# Patient Record
Sex: Female | Born: 1995 | Race: Black or African American | Hispanic: No | Marital: Single | Smoking: Never smoker
Health system: Southern US, Community
[De-identification: ages and names within clinical notes are randomized; demographics above are authoritative.]

---

## 2016-07-02 ENCOUNTER — Encounter (HOSPITAL_COMMUNITY): Payer: Self-pay

## 2016-07-02 ENCOUNTER — Emergency Department (HOSPITAL_COMMUNITY)
Admission: EM | Admit: 2016-07-02 | Discharge: 2016-07-02 | Disposition: A | Discharge status: Home / Self Care | Payer: BLUE CROSS/BLUE SHIELD | Attending: Emergency Medicine | Admitting: Emergency Medicine

## 2016-07-02 DIAGNOSIS — R300 Dysuria: Secondary | ICD-10-CM | POA: Diagnosis not present

## 2016-07-02 DIAGNOSIS — N898 Other specified noninflammatory disorders of vagina: Secondary | ICD-10-CM | POA: Insufficient documentation

## 2016-07-02 LAB — URINALYSIS, ROUTINE W REFLEX MICROSCOPIC
Bacteria, UA: NONE SEEN
Bilirubin Urine: NEGATIVE
GLUCOSE, UA: NEGATIVE mg/dL
KETONES UR: NEGATIVE mg/dL
Nitrite: NEGATIVE
PH: 6 (ref 5.0–8.0)
PROTEIN: NEGATIVE mg/dL
Specific Gravity, Urine: 1.011 (ref 1.005–1.030)

## 2016-07-02 LAB — WET PREP, GENITAL
CLUE CELLS WET PREP: NONE SEEN
Sperm: NONE SEEN
TRICH WET PREP: NONE SEEN
YEAST WET PREP: NONE SEEN

## 2016-07-02 LAB — POC URINE PREG, ED: Preg Test, Ur: NEGATIVE

## 2016-07-02 MED ORDER — AZITHROMYCIN 250 MG PO TABS
1000.0000 mg | ORAL_TABLET | Freq: Once | ORAL | Status: AC
  Administered 2016-07-02: 1000 mg via ORAL
  Filled 2016-07-02: qty 4

## 2016-07-02 MED ORDER — CEFTRIAXONE SODIUM 250 MG IJ SOLR
250.0000 mg | Freq: Once | INTRAMUSCULAR | Status: AC
  Administered 2016-07-02: 250 mg via INTRAMUSCULAR
  Filled 2016-07-02: qty 250

## 2016-07-02 MED ORDER — STERILE WATER FOR INJECTION IJ SOLN
INTRAMUSCULAR | Status: AC
  Administered 2016-07-02: 10 mL
  Filled 2016-07-02: qty 10

## 2016-07-02 NOTE — ED Triage Notes (Signed)
Pt complaining of burning with urination and white vaginal discharge x 4 days. Pt denies any abdominal pain, N/V/D.

## 2016-07-02 NOTE — Discharge Instructions (Signed)
Please read and follow all provided instructions.  Your diagnoses today include:  1. Vaginal discharge    Tests performed today include: Test for gonorrhea and chlamydia. You will be notified by telephone if you have a positive result. Vital signs. See below for your results today.   Medications:  You were treated for chlamydia (1 gram azithromycin pills) and gonorrhea (250mg  rocephin shot).  Home care instructions:  Read educational materials contained in this packet and follow any instructions provided.   You should tell your partners about your infection and avoid having sex for one week to allow time for the medicine to work.  Follow-up instructions: You [facility] to be tested for HIV, syphilis, and hepatitis -- all of which can be transmitted by sexual contact. We do not routinely screen for these in the Emergency Department.  STD Testing: [facility] [address], [place], phone [phone] or [phone]   Monday - Friday, call for an appointment [facility] [place] [address], [place], phone [phone] or [phone]  Monday - Friday, call for an appointment  Return instructions:  Please return to the Emergency Department if you experience worsening symptoms.  Please return if you have any other emergent concerns.  Additional Information:  Your vital signs today were: BP 118/77    Pulse 91    Temp 99.3 F (37.4 C) (Oral)    Resp 16    LMP 06/24/2016 (Exact Date)    SpO2 99%  If your blood pressure (BP) was elevated above 135/85 this visit, please have this repeated by your doctor within one month. --------------

## 2016-07-02 NOTE — ED Provider Notes (Signed)
[facility] Provider Note   CSN: [id number] Arrival date & time: 07/02/16  1707     History   Chief Complaint Chief Complaint  Patient presents with  . Dysuria  . Vaginal Discharge    HPI Victoria Reid is a 21 y.o. female.  HPI  21 y.o. female presents to the Emergency Department today complaining of vaginal discharge and dysuria x 4 days. No fevers. No N/V/D. No CP/SOB/ABD pain. Notes copious discharge thatt is white in color. Mild itching associated. No odor. Notes intercourse x 1 week ago with known partner. No other symptoms noted.    History reviewed. No pertinent past medical history.  There are no active problems to display for this patient.   History reviewed. No pertinent surgical history.  OB History    No data available       Home Medications    Prior to Admission medications   Not on File    Family History History reviewed. No pertinent family history.  Social History Social History  Substance Use Topics  . Smoking status: Never Smoker  . Smokeless tobacco: Never Used  . Alcohol use Yes     Allergies   Patient has no allergy information on record.   Review of Systems Review of Systems ROS reviewed and all are negative for acute change except as noted in the HPI.  Physical Exam Updated Vital Signs BP 118/77   Pulse 91   Temp 99.3 F (37.4 C) (Oral)   Resp 16   LMP 06/24/2016 (Exact Date)   SpO2 99%   Physical Exam  Constitutional: She is oriented to person, place, and time. Vital signs are normal. She appears well-developed and well-nourished.  HENT:  Head: Normocephalic and atraumatic.  Right Ear: Hearing normal.  Left Ear: Hearing normal.  Eyes: Conjunctivae and EOM are normal. Pupils are equal, round, and reactive to light.  Neck: Normal range of motion. Neck supple.  Cardiovascular: Normal rate, regular rhythm, normal heart sounds and intact distal pulses.   Pulmonary/Chest: Effort normal and breath sounds normal.    Abdominal: Soft. Normal appearance and bowel sounds are normal. There is no tenderness.  Neurological: She is alert and oriented to person, place, and time.  Skin: Skin is warm and dry.  Psychiatric: She has a normal mood and affect. Her speech is normal and behavior is normal. Thought content normal.  Nursing note and vitals reviewed.  Exam performed by Eston Esters,  exam chaperoned Date: 07/02/2016 Pelvic exam: normal external genitalia without evidence of trauma. VULVA: normal appearing vulva with no masses, tenderness or lesion. VAGINA: normal appearing vagina with normal color and discharge, no lesions. CERVIX: normal appearing cervix without lesions, cervical motion tenderness absent, cervical os closed with out purulent discharge; vaginal discharge - white and copious, Wet prep and DNA probe for chlamydia and GC obtained.   ADNEXA: normal adnexa in size, nontender and no masses UTERUS: uterus is normal size, shape, consistency and nontender.    ED Treatments / Results  Labs (all labs ordered are listed, but only abnormal results are displayed) Labs Reviewed  WET PREP, GENITAL - Abnormal; Notable for the following:       Result Value   WBC, Wet Prep HPF POC MANY (*)    All other components within normal limits  URINALYSIS, ROUTINE W REFLEX MICROSCOPIC - Abnormal; Notable for the following:    Color, Urine STRAW (*)    Hgb urine dipstick SMALL (*)    Leukocytes, UA SMALL (*)  Squamous Epithelial / LPF 0-5 (*)    All other components within normal limits  POC URINE PREG, ED  GC/CHLAMYDIA PROBE AMP ([place]) NOT AT [facility]   EKG  EKG Interpretation None      Radiology No results found.  Procedures Procedures (including critical care time)  Medications Ordered in ED Medications - No data to display   Initial Impression / Assessment and Plan / ED Course  I have reviewed the triage vital signs and the nursing notes.  Pertinent labs & imaging results that were  available during my care of the patient were reviewed by me and considered in my medical decision making (see chart for details).  Clinical Course    Final Clinical Impressions(s) / ED Diagnoses  {I have reviewed and evaluated the relevant laboratory values.   {I have reviewed the relevant previous healthcare records.  {I obtained HPI from historian.   ED Course:  Assessment: Pt is a 20yF who presents with vaginal discharge and dysuria x 4 days. No ABD pain. No N/V/D. Notes intercourse 1 week prior before symptoms began. On exam, pt in NAD. Nontoxic/nonseptic appearing. VSS. Afebrile. Lungs CTA. Heart RRR. Abdomen nontender soft. No CMT or Adnexal. White discharge noted. UA unremarkable. GC obtained. Wet Prep shows WBCs. Concern for STI. Given Azithro/Rocephin in ED. Plan is to DC home with follow up to PCP. At time of discharge, Patient is in no acute distress. Vital Signs are stable. Patient is able to ambulate. Patient able to tolerate PO.   Disposition/Plan:  DC Home Additional Verbal discharge instructions given and discussed with patient.  Pt Instructed to f/u with PCP in the next week for evaluation and treatment of symptoms. Return precautions given Pt acknowledges and agrees with plan  Supervising Physician Cathren LaineKevin Steinl, MD  Final diagnoses:  Vaginal discharge    New Prescriptions New Prescriptions   No medications on file     Audry Piliyler Jayley Hustead, PA-C 07/02/16 2041    Cathren LaineKevin Steinl, MD 07/07/16 [phone]

## 2016-07-03 LAB — GC/CHLAMYDIA PROBE AMP (~~LOC~~) NOT AT ARMC
Chlamydia: NEGATIVE
NEISSERIA GONORRHEA: NEGATIVE

## 2017-08-01 ENCOUNTER — Ambulatory Visit (HOSPITAL_COMMUNITY)
Admission: EM | Admit: 2017-08-01 | Discharge: 2017-08-01 | Disposition: A | Discharge status: Home / Self Care | Payer: BLUE CROSS/BLUE SHIELD | Attending: Family Medicine | Admitting: Family Medicine

## 2017-08-01 ENCOUNTER — Encounter (HOSPITAL_COMMUNITY): Payer: Self-pay | Admitting: Emergency Medicine

## 2017-08-01 ENCOUNTER — Other Ambulatory Visit: Payer: Self-pay

## 2017-08-01 DIAGNOSIS — M940 Chondrocostal junction syndrome [Tietze]: Secondary | ICD-10-CM | POA: Diagnosis not present

## 2017-08-01 DIAGNOSIS — H6121 Impacted cerumen, right ear: Secondary | ICD-10-CM | POA: Diagnosis not present

## 2017-08-01 NOTE — ED Provider Notes (Signed)
  [facility]   [id number] 08/01/17 Arrival Time: 1006  ASSESSMENT & PLAN:  1. Impacted cerumen of right ear   2. Costochondritis    Re-exam after L ear flush: patent ear canal; TM normal  OTC symptom care as needed. NSAID for costochondritis. May f/u with PCP or here as needed.  Reviewed expectations re: course of current medical issues. Questions answered. Outlined signs and symptoms indicating need for more acute intervention. Patient verbalized understanding. After Visit Summary given.   SUBJECTIVE: History from: patient.  Victoria Reid is a 22 y.o. female who presents with complaint of right ear "feeling clogged" without drainage or specific pain. Onset gradual, approximately 1 week ago. Recent cold symptoms: none. Fever: no. Overall normal PO intake without n/v. Sick contacts: no. OTC treatment: None.  Also reports anterior upper L chest wall discomfort. On/off over the past 1-2 weeks. No injury reported. Feels "sharp when I move." No associated CP/SOB. Sleeping ok. When present usually lasts a few seconds then resolves. No OTC treatment.  Social History   Tobacco Use  Smoking Status Never Smoker  Smokeless Tobacco Never Used    ROS: As per HPI.   OBJECTIVE:  Vitals:   08/01/17 1058  BP: 114/74  Pulse: 79  Resp: 16  Temp: 98.5 F (36.9 C)  TempSrc: Oral  SpO2: 100%    General appearance: alert; appears fatigued Ear Canal: normal TM: right: not visualized secondary to cerumen Neck: supple without LAD CV: RRR Chest Wall: anterior upper L tenderness to palpation that reproduces pain described Lungs: unlabored respirations, symmetrical air entry; cough: absent; no respiratory distress Skin: warm and dry Psychological: alert and cooperative; normal mood and affect  No Known Allergies   Social History   Socioeconomic History  . Marital status: Single    Spouse name: Not on file  . Number of children: Not on file  . Years of education: Not  on file  . Highest education level: Not on file  Social Needs  . Financial resource strain: Not on file  . Food insecurity - worry: Not on file  . Food insecurity - inability: Not on file  . Transportation needs - medical: Not on file  . Transportation needs - non-medical: Not on file  Occupational History  . Not on file  Tobacco Use  . Smoking status: Never Smoker  . Smokeless tobacco: Never Used  Substance and Sexual Activity  . Alcohol use: Yes  . Drug use: Not on file  . Sexual activity: Not on file  Other Topics Concern  . Not on file  Social History Narrative  . Not on file            Mardella LaymanHagler, Zelda Reames, MD 08/02/17 [phone]

## 2017-08-01 NOTE — ED Triage Notes (Signed)
Pt states "I cant hear out of my R ear, if I lay down a certain way I hear it popping." Pt also endorses chest pain x1 week.

## 2020-01-27 ENCOUNTER — Encounter (HOSPITAL_COMMUNITY): Payer: Self-pay

## 2020-01-27 ENCOUNTER — Ambulatory Visit (HOSPITAL_COMMUNITY)
Admission: EM | Admit: 2020-01-27 | Discharge: 2020-01-27 | Disposition: A | Discharge status: Home / Self Care | Payer: BLUE CROSS/BLUE SHIELD

## 2020-01-27 DIAGNOSIS — R21 Rash and other nonspecific skin eruption: Secondary | ICD-10-CM

## 2020-01-27 NOTE — Discharge Instructions (Addendum)
Keep using the hydrocortisone cream. Benadryl as needed. Follow up as needed for continued or worsening symptoms

## 2020-01-27 NOTE — ED Triage Notes (Signed)
Pt presents with rash on her chest since Tuesday. Unsure what it could be related too. Reports it started on her right breast and spread across. Reports taking benadryl and hydrocortisone that helped some.

## 2020-01-27 NOTE — ED Provider Notes (Signed)
[facility]    CSN: [id number] Arrival date & time: 01/27/20  1011      History   Chief Complaint Chief Complaint  Patient presents with  . Rash    HPI Victoria Reid is a 24 y.o. female.   Patient is a 24 year old female that presents today with rash.  The rash is been present for approximate 1 week.  The rash is located to her chest.  The rash is mildly itchy.  She feels as if the rash is improving.  Recently went to the beach.  Has been using hydrocortisone cream with improvement of symptoms.  Denies any fever, joint pain. Denies any recent changes in lotions, detergents, foods or other possible irritants. No recent travel. Nobody else at home has the rash. Patient has been outside but denies any contact with plants or insects. No new foods or medications.   ROS per HPI      History reviewed. No pertinent past medical history.  There are no problems to display for this patient.   History reviewed. No pertinent surgical history.  OB History   No obstetric history on file.      Home Medications    Prior to Admission medications   Not on File    Family History Family History  Problem Relation Age of Onset  . Healthy Mother   . Healthy Father     Social History Social History   Tobacco Use  . Smoking status: Never Smoker  . Smokeless tobacco: Never Used  Substance Use Topics  . Alcohol use: Yes  . Drug use: Not on file     Allergies   Patient has no known allergies.   Review of Systems Review of Systems   Physical Exam Triage Vital Signs ED Triage Vitals  Enc Vitals Group     BP 01/27/20 1216 (!) 147/80     Pulse Rate 01/27/20 1216 66     Resp 01/27/20 1216 20     Temp 01/27/20 1216 98.3 F (36.8 C)     Temp src --      SpO2 01/27/20 1216 98 %     Weight --      Height --      Head Circumference --      Peak Flow --      Pain Score 01/27/20 1214 0     Pain Loc --      Pain Edu? --      Excl. in GC? --    No data  found.  Updated Vital Signs BP (!) 147/80   Pulse 66   Temp 98.3 F (36.8 C)   Resp 20   LMP 01/02/2020   SpO2 98%   Visual Acuity Right Eye Distance:   Left Eye Distance:   Bilateral Distance:    Right Eye Near:   Left Eye Near:    Bilateral Near:     Physical Exam Vitals and nursing note reviewed.  Constitutional:      General: She is not in acute distress.    Appearance: Normal appearance. She is not ill-appearing, toxic-appearing or diaphoretic.  HENT:     Head: Normocephalic.     Nose: Nose normal.  Eyes:     Conjunctiva/sclera: Conjunctivae normal.  Pulmonary:     Effort: Pulmonary effort is normal.  Musculoskeletal:        General: Normal range of motion.     Cervical back: Normal range of motion.  Skin:    General:  Skin is warm and dry.     Findings: Rash present.     Comments: Mild healing maculopapular rash to chest area.   Neurological:     Mental Status: She is alert.  Psychiatric:        Mood and Affect: Mood normal.      UC Treatments / Results  Labs (all labs ordered are listed, but only abnormal results are displayed) Labs Reviewed - No data to display  EKG   Radiology No results found.  Procedures Procedures (including critical care time)  Medications Ordered in UC Medications - No data to display  Initial Impression / Assessment and Plan / UC Course  I have reviewed the triage vital signs and the nursing notes.  Pertinent labs & imaging results that were available during my care of the patient were reviewed by me and considered in my medical decision making (see chart for details).     Rash Rash appears to be healing.  Most likely some sort of contact dermatitis. Recommended continue using the Benadryl and hydrocortisone cream. Follow up as needed for continued or worsening symptoms  Final Clinical Impressions(s) / UC Diagnoses   Final diagnoses:  Rash     Discharge Instructions     Keep using the hydrocortisone  cream. Benadryl as needed. Follow up as needed for continued or worsening symptoms     ED Prescriptions    None     PDMP not reviewed this encounter.   Janace Aris, NP 01/27/20 1348

## 2020-09-24 ENCOUNTER — Other Ambulatory Visit: Payer: Self-pay

## 2020-09-24 ENCOUNTER — Emergency Department (HOSPITAL_COMMUNITY)
Admission: EM | Admit: 2020-09-24 | Discharge: 2020-09-25 | Discharge status: Against Medical Advice | Payer: BC Managed Care – PPO | Attending: Emergency Medicine | Admitting: Emergency Medicine

## 2020-09-24 DIAGNOSIS — M549 Dorsalgia, unspecified: Secondary | ICD-10-CM | POA: Insufficient documentation

## 2020-09-24 DIAGNOSIS — M542 Cervicalgia: Secondary | ICD-10-CM | POA: Diagnosis present

## 2020-09-24 DIAGNOSIS — Y92410 Unspecified street and highway as the place of occurrence of the external cause: Secondary | ICD-10-CM | POA: Diagnosis not present

## 2020-09-24 DIAGNOSIS — S46811A Strain of other muscles, fascia and tendons at shoulder and upper arm level, right arm, initial encounter: Secondary | ICD-10-CM | POA: Diagnosis not present

## 2020-09-24 DIAGNOSIS — Z5321 Procedure and treatment not carried out due to patient leaving prior to being seen by health care provider: Secondary | ICD-10-CM | POA: Insufficient documentation
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
## 2020-09-25 ENCOUNTER — Emergency Department (HOSPITAL_BASED_OUTPATIENT_CLINIC_OR_DEPARTMENT_OTHER): Payer: BC Managed Care – PPO

## 2020-09-25 ENCOUNTER — Encounter (HOSPITAL_BASED_OUTPATIENT_CLINIC_OR_DEPARTMENT_OTHER): Payer: Self-pay | Admitting: Emergency Medicine

## 2020-09-25 ENCOUNTER — Emergency Department (HOSPITAL_BASED_OUTPATIENT_CLINIC_OR_DEPARTMENT_OTHER)
Admission: EM | Admit: 2020-09-25 | Discharge: 2020-09-25 | Disposition: A | Discharge status: Home / Self Care | Payer: BC Managed Care – PPO | Source: Home / Self Care | Attending: Emergency Medicine | Admitting: Emergency Medicine

## 2020-09-25 DIAGNOSIS — S46811A Strain of other muscles, fascia and tendons at shoulder and upper arm level, right arm, initial encounter: Secondary | ICD-10-CM | POA: Insufficient documentation

## 2020-09-25 DIAGNOSIS — S46819A Strain of other muscles, fascia and tendons at shoulder and upper arm level, unspecified arm, initial encounter: Secondary | ICD-10-CM

## 2020-09-25 DIAGNOSIS — Y92410 Unspecified street and highway as the place of occurrence of the external cause: Secondary | ICD-10-CM | POA: Insufficient documentation

## 2020-09-25 MED ORDER — KETOROLAC TROMETHAMINE 15 MG/ML IJ SOLN
15.0000 mg | Freq: Once | INTRAMUSCULAR | Status: AC
  Administered 2020-09-25: 15 mg via INTRAMUSCULAR
  Filled 2020-09-25: qty 1

## 2020-09-25 MED ORDER — ACETAMINOPHEN 500 MG PO TABS
1000.0000 mg | ORAL_TABLET | Freq: Once | ORAL | Status: AC
  Administered 2020-09-25: 1000 mg via ORAL
  Filled 2020-09-25: qty 2

## 2020-09-25 NOTE — Discharge Instructions (Signed)
Your x-ray did not show an  acute injury.  You will hurt worse tomorrow this is normal.  Please return for difficulty breathing or severe abdominal pain.  Take 4 over the counter ibuprofen tablets 3 times a day or 2 over-the-counter naproxen tablets twice a day for pain. Also take tylenol 1000mg (2 extra strength) four times a day.
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
